# Patient Record
Sex: Female | Born: 1970 | Race: White | Hispanic: No | Marital: Married | Smoking: Current every day smoker
Health system: Southern US, Community
[De-identification: ages and names within clinical notes are randomized; demographics above are authoritative.]

## PROBLEM LIST (undated history)

## (undated) DIAGNOSIS — G43909 Migraine, unspecified, not intractable, without status migrainosus: Secondary | ICD-10-CM

## (undated) DIAGNOSIS — T7840XA Allergy, unspecified, initial encounter: Secondary | ICD-10-CM

## (undated) DIAGNOSIS — Z8742 Personal history of other diseases of the female genital tract: Secondary | ICD-10-CM

## (undated) DIAGNOSIS — Z8701 Personal history of pneumonia (recurrent): Secondary | ICD-10-CM

## (undated) DIAGNOSIS — J45909 Unspecified asthma, uncomplicated: Secondary | ICD-10-CM

## (undated) DIAGNOSIS — G932 Benign intracranial hypertension: Secondary | ICD-10-CM

## (undated) HISTORY — PX: ADENOIDECTOMY: SUR15

## (undated) HISTORY — DX: Migraine, unspecified, not intractable, without status migrainosus: G43.909

## (undated) HISTORY — DX: Unspecified asthma, uncomplicated: J45.909

## (undated) HISTORY — DX: Allergy, unspecified, initial encounter: T78.40XA

## (undated) HISTORY — PX: OTHER SURGICAL HISTORY: SHX169

## (undated) HISTORY — DX: Personal history of pneumonia (recurrent): Z87.01

## (undated) HISTORY — DX: Personal history of other diseases of the female genital tract: Z87.42

## (undated) HISTORY — PX: HAND TENDON SURGERY: SHX663

## (undated) HISTORY — DX: Benign intracranial hypertension: G93.2

## (undated) HISTORY — PX: TONSILLECTOMY: SUR1361

---

## 1994-11-14 DIAGNOSIS — Z8701 Personal history of pneumonia (recurrent): Secondary | ICD-10-CM

## 1994-11-14 HISTORY — DX: Personal history of pneumonia (recurrent): Z87.01

## 1995-11-15 HISTORY — PX: LEEP: SHX91

## 2000-11-14 HISTORY — PX: BREAST SURGERY: SHX581

## 2001-11-14 HISTORY — PX: SPINE SURGERY: SHX786

## 2005-10-24 ENCOUNTER — Ambulatory Visit: Payer: Self-pay | Admitting: Family Medicine

## 2007-04-03 ENCOUNTER — Ambulatory Visit: Payer: Self-pay | Admitting: Emergency Medicine

## 2007-06-25 ENCOUNTER — Ambulatory Visit: Payer: Self-pay | Admitting: Emergency Medicine

## 2007-07-23 ENCOUNTER — Ambulatory Visit: Payer: Self-pay | Admitting: Emergency Medicine

## 2008-09-29 ENCOUNTER — Ambulatory Visit: Payer: Self-pay | Admitting: Family Medicine

## 2010-01-03 ENCOUNTER — Emergency Department: Payer: Self-pay | Admitting: Emergency Medicine

## 2012-07-24 ENCOUNTER — Ambulatory Visit: Payer: Self-pay | Admitting: Internal Medicine

## 2015-01-15 ENCOUNTER — Ambulatory Visit: Payer: Self-pay | Admitting: Physician Assistant

## 2016-03-01 ENCOUNTER — Encounter: Payer: Self-pay | Admitting: *Deleted

## 2016-03-17 ENCOUNTER — Ambulatory Visit (INDEPENDENT_AMBULATORY_CARE_PROVIDER_SITE_OTHER): Payer: BLUE CROSS/BLUE SHIELD | Admitting: General Surgery

## 2016-03-17 ENCOUNTER — Encounter: Payer: Self-pay | Admitting: General Surgery

## 2016-03-17 VITALS — BP 146/80 | HR 89 | Resp 13 | Ht 62.0 in | Wt 135.0 lb

## 2016-03-17 DIAGNOSIS — R1900 Intra-abdominal and pelvic swelling, mass and lump, unspecified site: Secondary | ICD-10-CM | POA: Diagnosis not present

## 2016-03-17 DIAGNOSIS — R1030 Lower abdominal pain, unspecified: Secondary | ICD-10-CM | POA: Diagnosis not present

## 2016-03-17 DIAGNOSIS — R222 Localized swelling, mass and lump, trunk: Secondary | ICD-10-CM

## 2016-03-17 NOTE — Patient Instructions (Signed)
Patient has been scheduled for a CT abdomen/pelvis without contrast (oral contrast only) at [facility] for 03-28-16 at 9 am (arrive 8:45 am). Prep: NPO 4 hours prior and pick up prep kit. Patient verbalizes understanding.
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
## 2016-03-18 DIAGNOSIS — R222 Localized swelling, mass and lump, trunk: Secondary | ICD-10-CM | POA: Insufficient documentation

## 2016-03-18 DIAGNOSIS — R1030 Lower abdominal pain, unspecified: Secondary | ICD-10-CM | POA: Insufficient documentation

## 2016-03-28 ENCOUNTER — Ambulatory Visit
Admission: RE | Admit: 2016-03-28 | Discharge: 2016-03-28 | Disposition: A | Discharge status: Home / Self Care | Payer: BLUE CROSS/BLUE SHIELD | Source: Ambulatory Visit | Attending: General Surgery | Admitting: General Surgery

## 2016-03-28 DIAGNOSIS — R222 Localized swelling, mass and lump, trunk: Secondary | ICD-10-CM

## 2016-03-28 DIAGNOSIS — R1900 Intra-abdominal and pelvic swelling, mass and lump, unspecified site: Secondary | ICD-10-CM | POA: Insufficient documentation

## 2016-03-30 ENCOUNTER — Telehealth: Payer: Self-pay

## 2016-03-30 NOTE — Telephone Encounter (Signed)
-----   Message from Robert Bellow, MD sent at 03/30/2016  8:27 AM EDT ----- Please notify the patient the CT is normal. Soft mass in left lower quadrant likely fatty tissue. Would not recommend removal.  Follow up if needed to discuss.  ----- Message -----    From: Rad Results In Interface    Sent: 03/28/2016   9:46 AM      To: Robert Bellow, MD

## 2016-03-30 NOTE — Telephone Encounter (Signed)
Notified patient as instructed, patient pleased. Discussed follow-up appointment. Patient to follow up here on 04/04/16 at 10:45 am with Dr Bary Castilla.

## 2016-04-04 ENCOUNTER — Encounter: Payer: Self-pay | Admitting: General Surgery

## 2016-04-04 ENCOUNTER — Ambulatory Visit (INDEPENDENT_AMBULATORY_CARE_PROVIDER_SITE_OTHER): Payer: BLUE CROSS/BLUE SHIELD | Admitting: General Surgery

## 2016-04-04 VITALS — BP 120/68 | HR 82 | Resp 12 | Ht 62.0 in | Wt 137.0 lb

## 2016-04-04 DIAGNOSIS — R1900 Intra-abdominal and pelvic swelling, mass and lump, unspecified site: Secondary | ICD-10-CM | POA: Diagnosis not present

## 2016-04-04 DIAGNOSIS — R222 Localized swelling, mass and lump, trunk: Secondary | ICD-10-CM

## 2016-04-04 NOTE — Progress Notes (Signed)
Patient ID: Desiree Moss, female   DOB: [birth year], 45 y.o.   MRN: [id number]  Chief Complaint  Patient presents with  . Follow-up    HPI Desiree Moss is a 45 y.o. female hree today for her follow up ct scan done on 03/28/16. Patient states she is still having abdomidal pain in her left lower quadrant. The CT scan results have been forwarded to the patient. No intra-abdominal pathology identified.  The patient continues to report pain in the left lower quadrant centered on the palpable mass evident on clinical exam.  I personally reviewed the patient's history. HPI  Past Medical History  Diagnosis Date  . Allergy   . Asthma   . Migraine   . History of pneumonia 1996  . History of ovarian cyst   . Pseudotumor cerebri     Past Surgical History  Procedure Laterality Date  . Tubes in ears  age 75  . Tonsillectomy  age 54  . Adenoidectomy  age 68, age 35  . Breast surgery Bilateral 2002    augmentation  . Spine surgery  2003    lower lumbar fusion  . Leep  1997  . Hand tendon surgery Bilateral     Family History  Problem Relation Age of Onset  . Cancer      Negative Family History    Social History Social History  Substance Use Topics  . Smoking status: Current Every Day Smoker -- 1.00 packs/day for 13 years    Types: Cigarettes  . Smokeless tobacco: Never Used  . Alcohol Use: 0.0 oz/week    0 Standard drinks or equivalent per week    Allergies  Allergen Reactions  . Erythromycin Palpitations  . Iodine Anaphylaxis  . Oxycodone Rash    Current Outpatient Prescriptions  Medication Sig Dispense Refill  . diphenhydrAMINE (BENADRYL) 25 MG tablet Take 25 mg by mouth 4 (four) times daily.    . medroxyPROGESTERone (DEPO-PROVERA) 150 MG/ML injection Inject 150 mg into the muscle every 3 (three) months.    . Multiple Vitamin (MULTIVITAMIN) tablet Take 1 tablet by mouth daily.    Marland Kitchen oxymetazoline (AFRIN 12 HOUR) 0.05 % nasal spray Place 1 spray into both  nostrils 2 (two) times daily.    . ranitidine (ZANTAC) 75 MG tablet Take 75 mg by mouth 2 (two) times daily.     No current facility-administered medications for this visit.    Review of Systems Review of Systems  Blood pressure 120/68, pulse 82, resp. rate 12, height 5\' 2"  (1.575 m), weight 137 lb (62.143 kg).  Physical Exam Physical Exam  Constitutional: She is oriented to person, place, and time. She appears well-developed and well-nourished.  Eyes: Conjunctivae are normal. No scleral icterus.  Neck: Neck supple.  Cardiovascular: Normal rate, regular rhythm and normal heart sounds.   Pulmonary/Chest: Effort normal and breath sounds normal.  Abdominal: Soft. Normal appearance and bowel sounds are normal. There is tenderness in the left lower quadrant.    Lymphadenopathy:    She has no cervical adenopathy.  Neurological: She is alert and oriented to person, place, and time.  Skin: Skin is warm.    Data Reviewed CT reviewed as noted above.  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    Plan    The patient is aware that removal of this mass may or may not relieve her pain symptoms. No evidence of that laparoscopy is indicated.  Procedure can be completed in office  with local anesthesia.     Patient to return for left lower mass removal PCP:  No Pcp  This information has been scribed by Gaspar Cola CMA.    Robert Bellow 04/05/2016, 12:41 PM

## 2016-04-04 NOTE — Patient Instructions (Addendum)
Patient to return for left lower mass removal

## 2016-04-12 ENCOUNTER — Encounter: Payer: Self-pay | Admitting: General Surgery

## 2016-04-12 ENCOUNTER — Ambulatory Visit (INDEPENDENT_AMBULATORY_CARE_PROVIDER_SITE_OTHER): Payer: BLUE CROSS/BLUE SHIELD | Admitting: General Surgery

## 2016-04-12 VITALS — BP 122/70 | HR 82 | Resp 14 | Ht 62.0 in | Wt 130.0 lb

## 2016-04-12 DIAGNOSIS — D171 Benign lipomatous neoplasm of skin and subcutaneous tissue of trunk: Secondary | ICD-10-CM | POA: Diagnosis not present

## 2016-04-12 DIAGNOSIS — R1900 Intra-abdominal and pelvic swelling, mass and lump, unspecified site: Secondary | ICD-10-CM | POA: Diagnosis not present

## 2016-04-12 NOTE — Patient Instructions (Addendum)
The patient is aware to call back for any questions or concerns. Keep area clean, ice pack for comfort troday May remove dressing in 2-3 days, steri strips will come off gradually

## 2016-04-12 NOTE — Progress Notes (Signed)
Patient ID: Desiree Moss, female   DOB: Apr 23, 1971, 45 y.o.   MRN: [id number]  Chief Complaint  Patient presents with  . Procedure    excision lipoma abdomen    HPI Desiree Moss is a 45 y.o. female.  Here today for excision abdominal lipoma.   HPI  Past Medical History  Diagnosis Date  . Allergy   . Asthma   . Migraine   . History of pneumonia 1996  . History of ovarian cyst   . Pseudotumor cerebri     Past Surgical History  Procedure Laterality Date  . Tubes in ears  age 41  . Tonsillectomy  age 44  . Adenoidectomy  age 17, age 59  . Breast surgery Bilateral 2002    augmentation  . Spine surgery  2003    lower lumbar fusion  . Leep  1997  . Hand tendon surgery Bilateral     Family History  Problem Relation Age of Onset  . Cancer      Negative Family History    Social History Social History  Substance Use Topics  . Smoking status: Current Every Day Smoker -- 1.00 packs/day for 13 years    Types: Cigarettes  . Smokeless tobacco: Never Used  . Alcohol Use: 0.0 oz/week    0 Standard drinks or equivalent per week    Allergies  Allergen Reactions  . Erythromycin Palpitations  . Iodine Anaphylaxis  . Oxycodone Rash    Current Outpatient Prescriptions  Medication Sig Dispense Refill  . diphenhydrAMINE (BENADRYL) 25 MG tablet Take 25 mg by mouth 4 (four) times daily.    . medroxyPROGESTERone (DEPO-PROVERA) 150 MG/ML injection Inject 150 mg into the muscle every 3 (three) months.    . Multiple Vitamin (MULTIVITAMIN) tablet Take 1 tablet by mouth daily.    Marland Kitchen oxymetazoline (AFRIN 12 HOUR) 0.05 % nasal spray Place 1 spray into both nostrils 2 (two) times daily.    . ranitidine (ZANTAC) 75 MG tablet Take 75 mg by mouth 2 (two) times daily.     No current facility-administered medications for this visit.    Review of Systems Review of Systems  Constitutional: Negative.   Respiratory: Negative.   Cardiovascular: Negative.     Blood pressure  122/70, pulse 82, resp. rate 14, height 5\' 2"  (1.575 m), weight 130 lb (58.968 kg).  Physical Exam Physical Exam  Abdominal:      Data Reviewed CT of the abdomen and pelvis dated 03/28/2016. No fascial defect identified.  Assessment    Symptomatic lipoma left lower abdominal wall.    Plan    The procedure was reviewed. The area was marked. Field block anesthesia was established with a total of 30 mL of 0.5% Xylocaine with 0.25% Marcaine with 1-200,000 epinephrine. ChloraPrep was applied to the skin. A transverse incision over the mass was made in the skin and subcutaneous tissue divided. Hemostasis was with 3-0 Vicryl ties. The lipoma was identified and measured approximately 3 x 3 x 6 cm in size. This extended medially but did not appear to violate the fascia. The deep tissue was approximated with a running 3-0 Vicryl suture. The skin closed with a running 4-0 Vicryl subcuticular suture. Benzoin, Steri-Strips, Telfa and Tegaderm dressing applied.  Ice pack provided. Postoperative wound care reviewed.  The patient will contact the office if she has any concerns about wound healing. Follow up otherwise will be on an as-needed basis.      PCP:  No Pcp  This  information has been scribed by Karie Fetch RN, BSN,BC.    Desiree Moss 04/13/2016, 9:06 PM

## 2016-04-13 DIAGNOSIS — D171 Benign lipomatous neoplasm of skin and subcutaneous tissue of trunk: Secondary | ICD-10-CM | POA: Insufficient documentation

## 2016-04-14 ENCOUNTER — Telehealth: Payer: Self-pay | Admitting: *Deleted

## 2016-04-14 NOTE — Telephone Encounter (Signed)
-----   Message from Robert Bellow, MD sent at 04/14/2016 10:55 AM EDT ----- Please notify the patient that the pathology was a simple lipoma. Asked her to give a phone report in 2-4 weeks with regards to the pain she was experiencing left lower quadrant. Thank you ----- Message -----    From: [facility] Interface    Sent: 04/14/2016   9:44 AM      To: Robert Bellow, MD

## 2016-04-14 NOTE — Telephone Encounter (Signed)
Notified patient as instructed, patient pleased. Discussed follow-up appointments, patient agrees. She states that she will not have to wait to call back because she is not having any pain or discomfort since the lipoma was removed.

## 2016-10-22 IMAGING — CT CT ABD-PELV W/O CM
2 of 4 series · 17 of 46 positions shown, 19 images · non-contrast
Comparison: None.

CLINICAL DATA: Left-sided abdominal pain with increased over the
past 2 months

EXAM:
CT ABDOMEN AND PELVIS WITHOUT CONTRAST
TECHNIQUE: Multidetector CT imaging of the abdomen and pelvis was performed
following the standard protocol without IV contrast.

[Series 2: abd pel wo · axial · 0.61mm/px · z∈[-766,-391]mm · 14 of 83 slices shown, 16 images]
[im 4/83  soft-tissue]
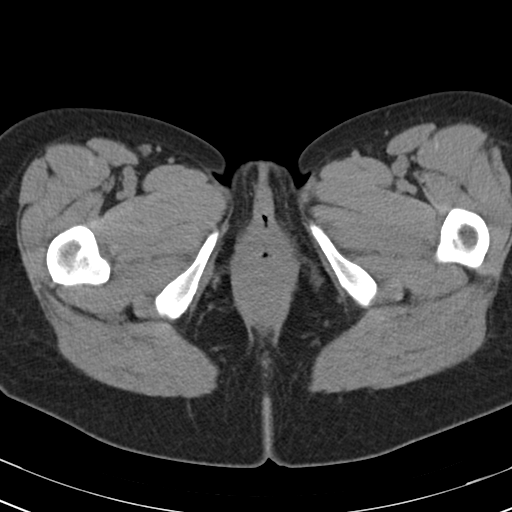
[im 4/83  bone]
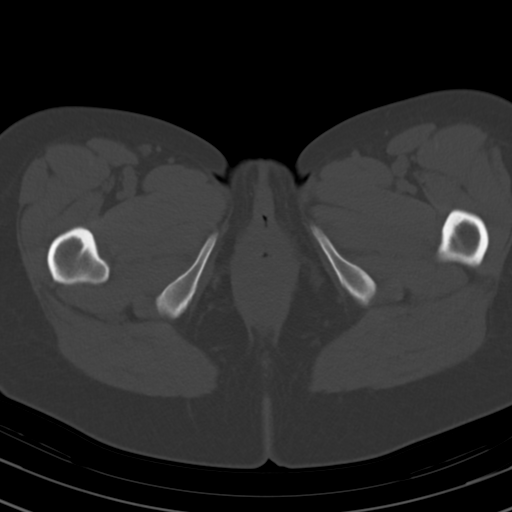
[im 10/83  soft-tissue]
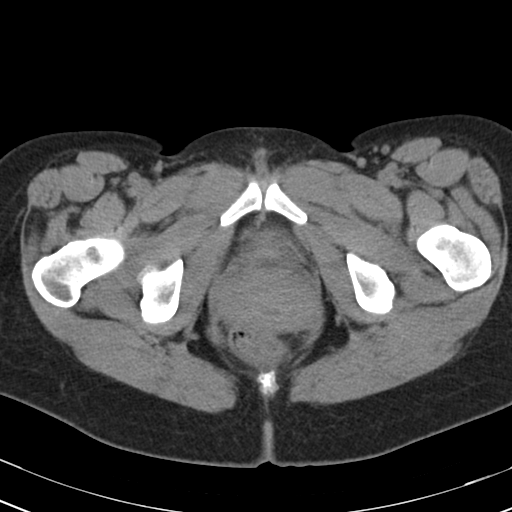
[im 16/83  soft-tissue]
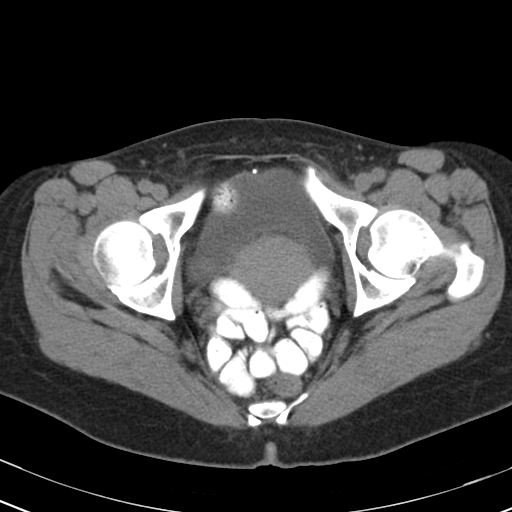
[im 23/83  soft-tissue]
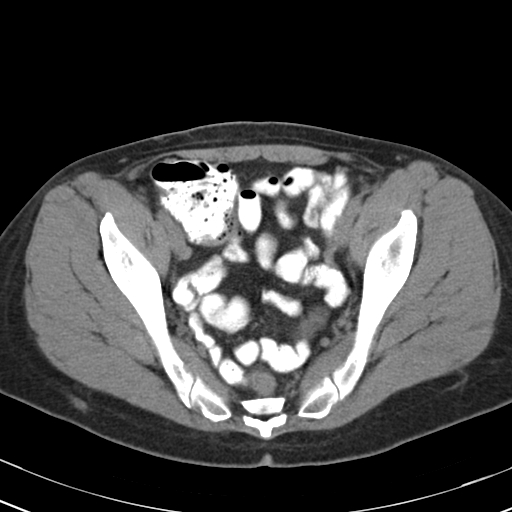
[im 29/83  soft-tissue]
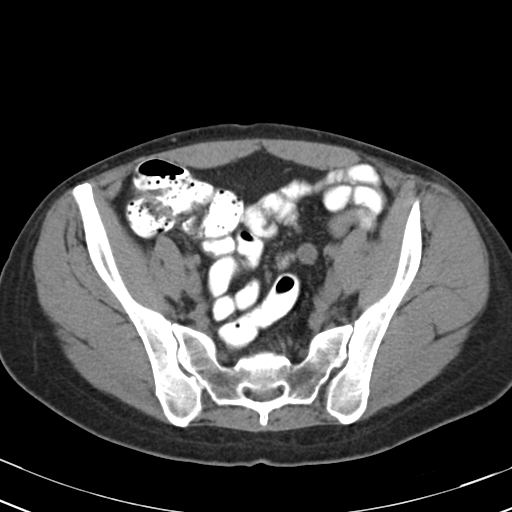
[im 32/83  soft-tissue]
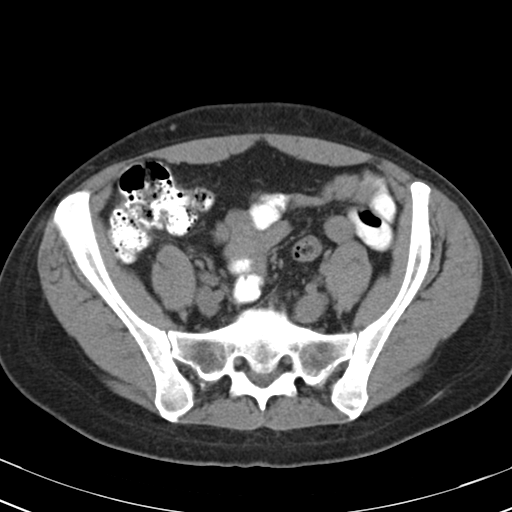
[im 38/83  soft-tissue]
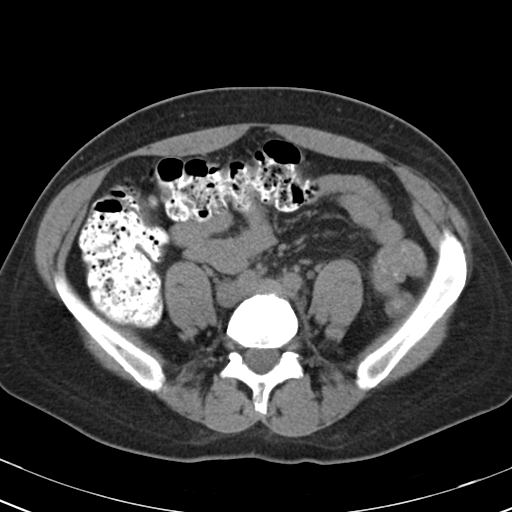
[im 45/83  soft-tissue]
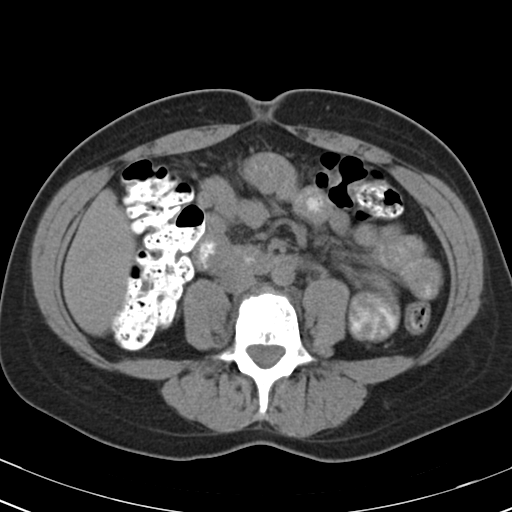
[im 51/83  soft-tissue]
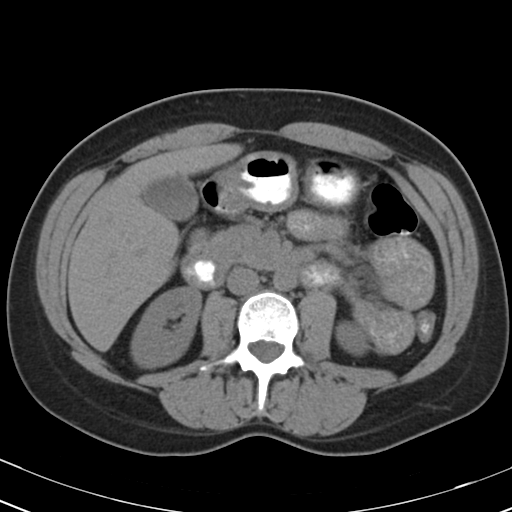
[im 51/83  bone]
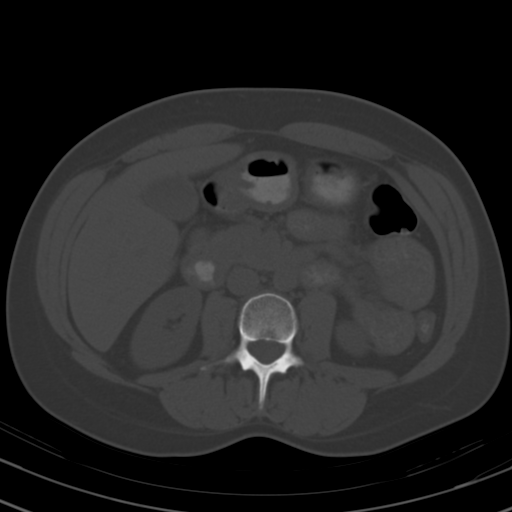
[im 54/83  soft-tissue]
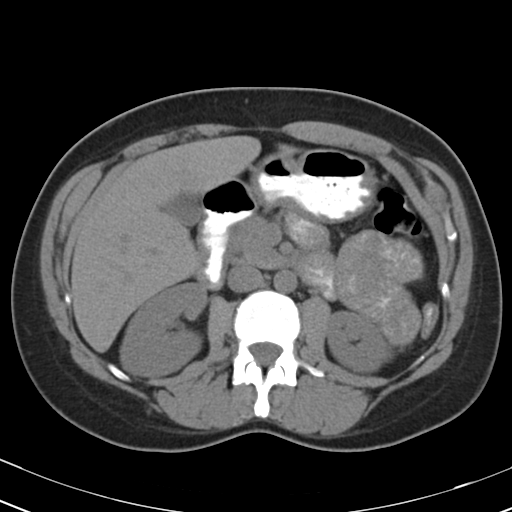
[im 60/83  soft-tissue]
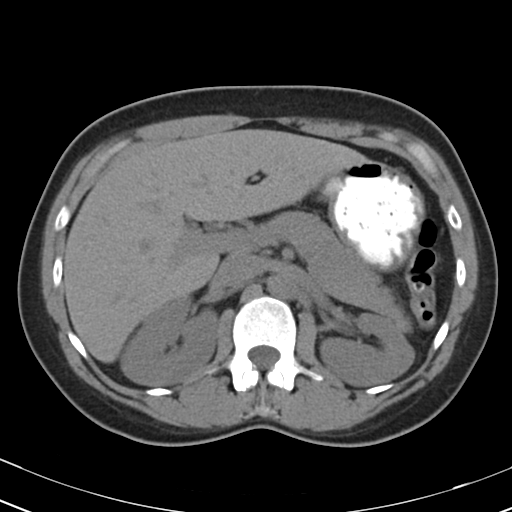
[im 67/83  soft-tissue]
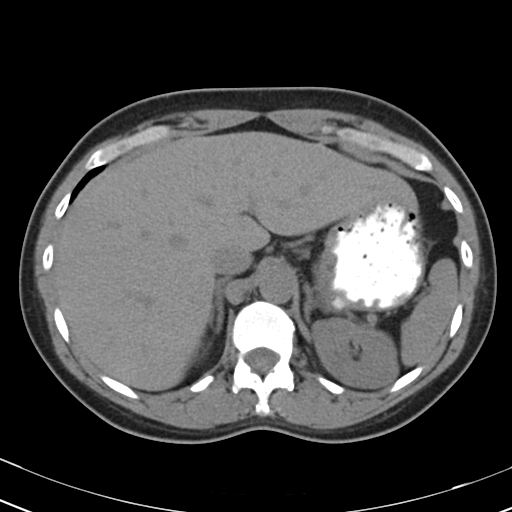
[im 73/83  soft-tissue]
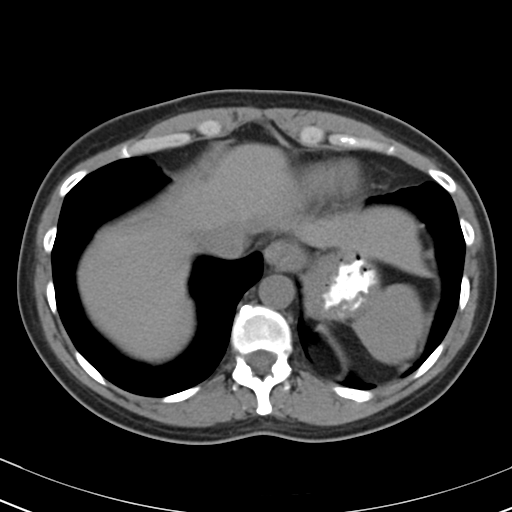
[im 79/83  soft-tissue]
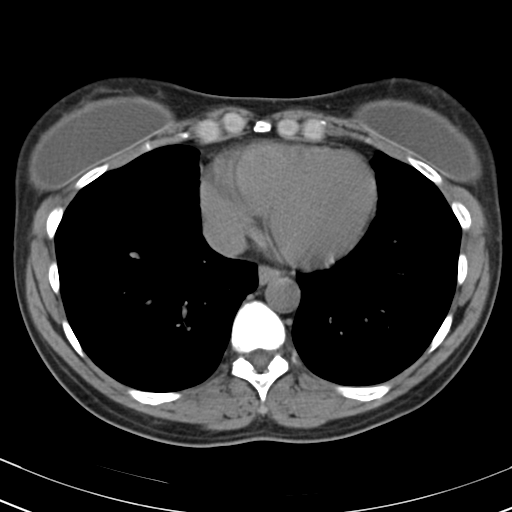

[Series 602: coronal · coronal · 0.82mm/px · 3 of 90 slices shown]
[im 30/90  soft-tissue]
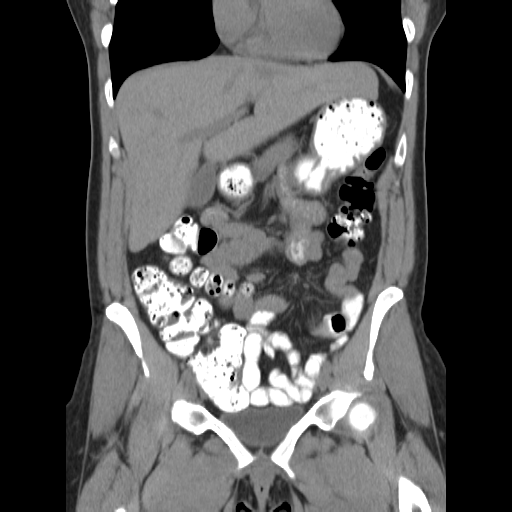
[im 40/90  soft-tissue]
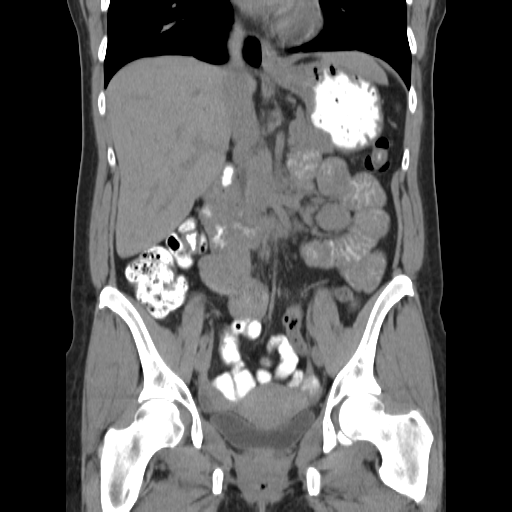
[im 50/90  soft-tissue]
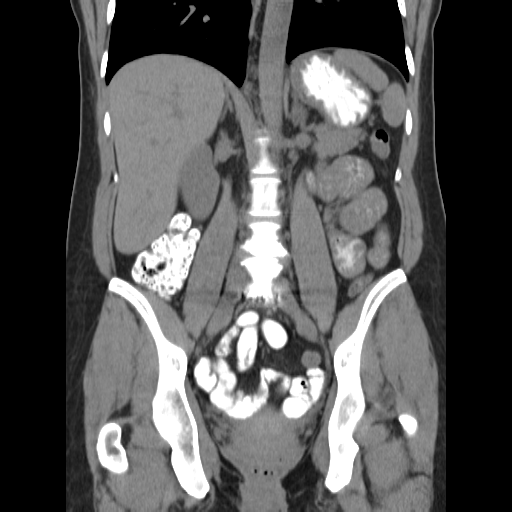

[17 of 46 positions shown; findings below may reference images not displayed]

FINDINGS: Lung bases are free of acute infiltrate or sizable effusion.
Bilateral breast implants are noted.

The liver, gallbladder, spleen, adrenal glands and pancreas are
within normal limits. Kidneys are well visualized bilaterally. No
renal calculi or obstructive changes are seen.

Contrast material is noted scattered throughout the large and small
bowel. No obstructive changes are seen. No findings to suggest
diverticulosis or diverticulitis are noted.

No significant lymphadenopathy is noted. The bladder is partially
distended. The uterus and ovaries are within normal limits. No free
pelvic fluid or sidewall mass lesion is seen. The osseous structures
show postsurgical changes in the lumbar spine. No acute bony
abnormality is noted.
IMPRESSION: No acute abnormality noted.

## 2018-10-26 ENCOUNTER — Telehealth: Payer: Self-pay

## 2018-10-26 NOTE — Telephone Encounter (Signed)
Anderson Malta from [facility] calling for prior mammogram images.  [phone]
